# Patient Record
Sex: Male | Born: 1985 | Hispanic: Yes | Smoking: Never smoker
Health system: Southern US, Community
[De-identification: ages and names within clinical notes are randomized; demographics above are authoritative.]

---

## 2016-01-07 ENCOUNTER — Emergency Department (HOSPITAL_COMMUNITY)
Admission: EM | Admit: 2016-01-07 | Discharge: 2016-01-07 | Disposition: A | Discharge status: Home / Self Care | Payer: Self-pay | Attending: Emergency Medicine | Admitting: Emergency Medicine

## 2016-01-07 ENCOUNTER — Emergency Department (HOSPITAL_COMMUNITY): Payer: Self-pay

## 2016-01-07 ENCOUNTER — Encounter (HOSPITAL_COMMUNITY): Payer: Self-pay

## 2016-01-07 DIAGNOSIS — J029 Acute pharyngitis, unspecified: Secondary | ICD-10-CM | POA: Insufficient documentation

## 2016-01-07 DIAGNOSIS — R040 Epistaxis: Secondary | ICD-10-CM | POA: Insufficient documentation

## 2016-01-07 DIAGNOSIS — M549 Dorsalgia, unspecified: Secondary | ICD-10-CM | POA: Insufficient documentation

## 2016-01-07 NOTE — Discharge Instructions (Signed)
Dolor de garganta  °(Sore Throat) ° El dolor de garganta es el dolor, ardor o sensación de picazón en la garganta. Puede haber dolor o molestias al tragar o hablar. Es posible que tenga otros síntomas junto al dolor de garganta. Puede haber tos, estornudos, fiebre o una inflamación en el cuello. Generalmente es el primer signo de otra enfermedad. Estas enfermedades pueden incluir un resfriado, gripe, dolor de garganta o una infección llamada mononucleosis infecciosa. Generalmente el dolor de garganta desaparece sin tratamiento médico.  °CUIDADOS EN EL HOGAR  °· Sólo tome los medicamentos que le indique el médico. °· Beba gran cantidad de líquido para mantener el pis (orina) de tono claro o amarillo pálido. °· Descanse todo lo que sea necesario. °· Trate de usar aerosoles para la garganta, pastillas o chupe caramelos duros (si es mayor de 4 años o según lo que le indiquen). °· Beba líquidos calientes, como caldos, infusiones o agua caliente con miel. Trate de chupar paletas de hielo congelado o beber líquidos fríos. °· Enjuáguese la boca (gárgaras) con agua salada. Mezcle 1 cucharadita de sal en 8 onzas de agua. °· No fume. Evite estar cerca a otros cuando están fumando. °· Ponga un humidificador en su habitación por la noche para humedecer el aire. También puede abrir la ducha de agua caliente y sentarse en el baño durante 5-10 minutos. Asegúrese de que la puerta del baño esté cerrada. °SOLICITE AYUDA DE INMEDIATO SI:  °· Tiene dificultad para respirar. °· No puede tragar líquidos, alimentos blandos o su saliva. °· Usted tiene más inflamación (hinchazón) en la garganta. °· El dolor de garganta no mejora en 7 días. °· Siente malestar estomacal (náuseas) y vomita. °· Tiene fiebre o síntomas que persisten durante más de 2-3 días. °· Tiene fiebre y los síntomas empeoran de manera súbita. °ASEGÚRESE DE QUE:  °· Comprende estas instrucciones. °· Controlará su enfermedad. °· Solicitará ayuda de inmediato si no mejora o si  empeora. °  °Esta información no tiene como fin reemplazar el consejo del médico. Asegúrese de hacerle al médico cualquier pregunta que tenga. °  °Document Released: 09/09/2012 °Elsevier Interactive Patient Education ©2016 Elsevier Inc. ° ° °

## 2016-01-07 NOTE — ED Provider Notes (Signed)
CSN: [id number]     Arrival date & time 01/07/16  2027 History  By signing my name below, I, Evon Slackerrance Branch, attest that this documentation has been prepared under the direction and in the presence of Newell RubbermaidJeffrey Hearl Heikes, PA-C. Electronically Signed: Evon Slackerrance Branch, ED Scribe. 01/07/2016. 10:23 PM.     Chief Complaint  Patient presents with  . Sore Throat   The history is provided by the patient. No language interpreter was used.   HPI Comments: Kendrick Ranchsidro Sandin is a 30 y.o. male who presents to the Emergency Department complaining of sore throat onset 2 weeks prior. Pt reports that he swallowed a fish bone 6 years prior. Pt states his throat has recent been irritated again within in the last 2 weeks. Pt states that it is painful to swallow. Pt states that he has associated back pain from the sore throat. Pt also reports intermittent cough with epistaxis for the last week. Pt states that the pain is worse when breathing, laughing and eating. Pt denies tobacco use. Pt denies fevers or unexpected weight loss.  Marland Kitchen.    History reviewed. No pertinent past medical history. History reviewed. No pertinent past surgical history. No family history on file. Social History  Substance Use Topics  . Smoking status: Never Smoker   . Smokeless tobacco: None  . Alcohol Use: Yes    Review of Systems  All other systems reviewed and are negative.   Allergies  Review of patient's allergies indicates no known allergies.  Home Medications   Prior to Admission medications   Not on File   BP 117/69 mmHg  Pulse 66  Temp(Src) 99.1 F (37.3 C) (Oral)  Resp 18  SpO2 97%   Physical Exam  Constitutional: He is oriented to person, place, and time. He appears well-developed and well-nourished. No distress.  HENT:  Head: Normocephalic and atraumatic.  Mouth/Throat: Uvula is midline, oropharynx is clear and moist and mucous membranes are normal. No oropharyngeal exudate, posterior oropharyngeal edema,  posterior oropharyngeal erythema or tonsillar abscesses.  Eyes: Conjunctivae and EOM are normal.  Neck: Neck supple. No tracheal deviation present.  No asymmetry of the neck noted, nontender to palpation  Cardiovascular: Normal rate.   Pulmonary/Chest: Effort normal. No respiratory distress. He has no wheezes. He has no rales. He exhibits no tenderness.  Musculoskeletal: Normal range of motion.  Neurological: He is alert and oriented to person, place, and time.  Skin: Skin is warm and dry.  Psychiatric: He has a normal mood and affect. His behavior is normal.  Nursing note and vitals reviewed.   ED Course  Procedures (including critical care time) DIAGNOSTIC STUDIES: Oxygen Saturation is 97% on RA, normal by my interpretation.    COORDINATION OF CARE: 10:23 PM-Discussed treatment plan with pt at bedside and pt agreed to plan.    Labs Review Labs Reviewed - No data to display  Imaging Review Dg Neck Soft Tissue  01/07/2016  CLINICAL DATA:  Sore throat, difficulty swallowing, epistaxis and coughing. History of fish bone ingestion. EXAM: NECK SOFT TISSUES - 1+ VIEW COMPARISON:  None. FINDINGS: There is no evidence of retropharyngeal soft tissue swelling or epiglottic enlargement. The cervical airway is unremarkable and no radio-opaque foreign body identified. IMPRESSION: Negative. Electronically Signed   By: Awilda Metroourtnay  Bloomer M.D.   On: 01/07/2016 23:12   Dg Chest 2 View  01/07/2016  CLINICAL DATA:  Sore throat, difficulty swallowing, epistaxis and coughing. History of fish bone ingestion. EXAM: CHEST  2 VIEW COMPARISON:  None.  FINDINGS: The heart size and mediastinal contours are within normal limits. Both lungs are clear. The visualized skeletal structures are unremarkable. IMPRESSION: Normal chest. Electronically Signed   By: Awilda Metro M.D.   On: 01/07/2016 23:11      EKG Interpretation None      MDM   Final diagnoses:  Sore throat   Labs: DG chest and throat    Imaging:  Consults:  Therapeutics:  Discharge Meds:   Assessment/Plan: 30 year old male presents today with complaints of sore throat. This is been present for 6 months, worsening over the last several weeks. Patient has no signs of infectious etiology, allergic. Patient reports this started after swallowing a fish bone. I cannot find any signs of trauma to the throat, radiographs show no evidence of radiopaque object. I have low suspicion for cancerous etiology as patient has no fevers, weight loss, systemic symptoms, and this is been present for the last 6 months. [facility] He will be referred to ENT for further evaluation, encouraged follow-up with primary care for reevaluation as well. Translator was used in addition to patient's son who is very helpful throughout evaluation.   I personally performed the services described in this documentation, which was scribed in my presence. The recorded information has been reviewed and is accurate.    Eyvonne Mechanic, PA-C 01/07/16 2344  Margarita Grizzle, MD 01/08/16 [phone]

## 2016-01-07 NOTE — ED Notes (Addendum)
Pt here with c/o sore throat and painful to swallow, he states it feels like a butterfly tickling his throat; onset two weeks ago but pain worsens with each day. He also reports nose bleeds when he coughs and laughs. No active bleeding.

## 2017-10-07 IMAGING — DX DG CHEST 2V
2 series · 2 of 2 positions shown · non-contrast
Comparison: None.

CLINICAL DATA: Sore throat, difficulty swallowing, epistaxis and
coughing. History of fish bone ingestion.

EXAM:
CHEST  2 VIEW

[w chest pa]
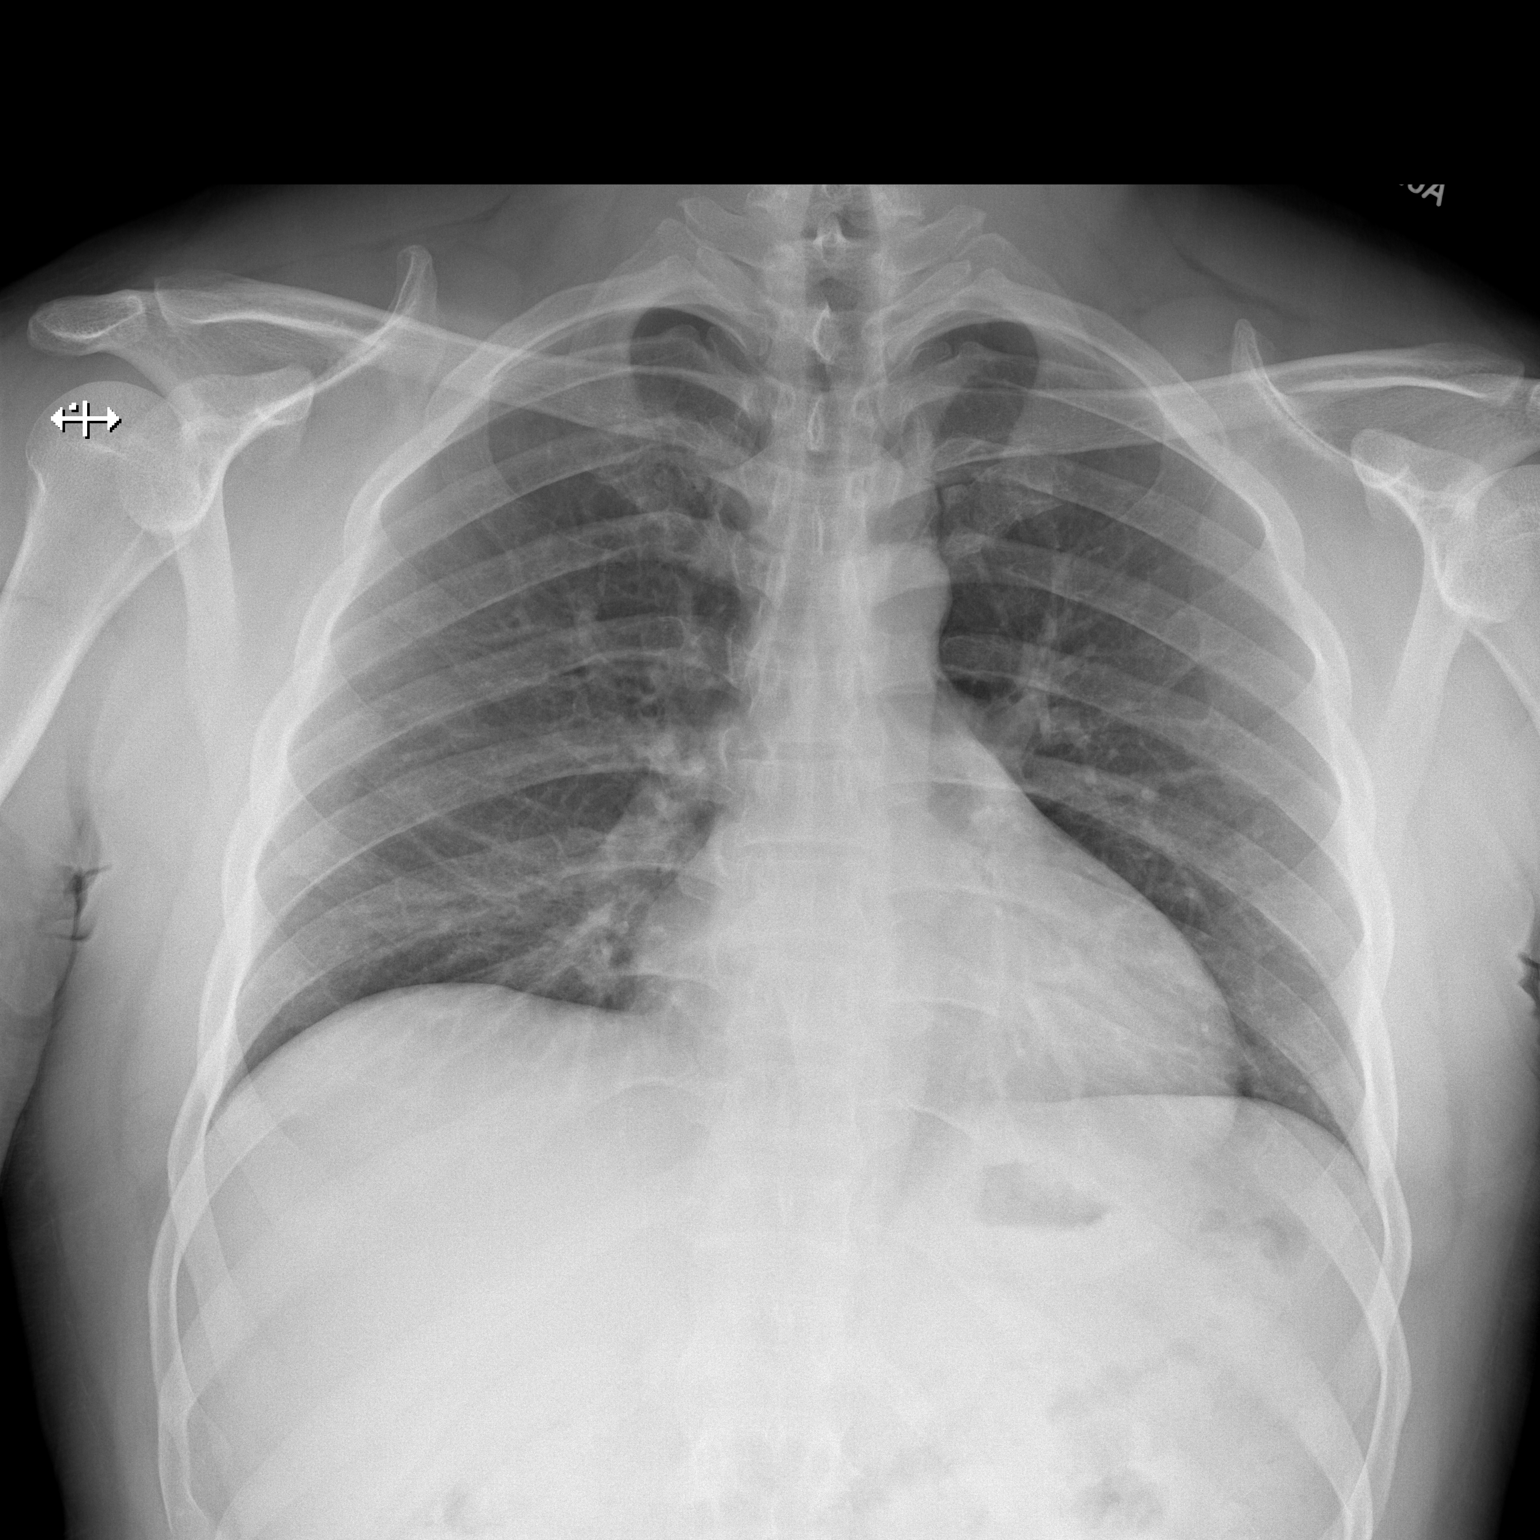

[w chest lat]
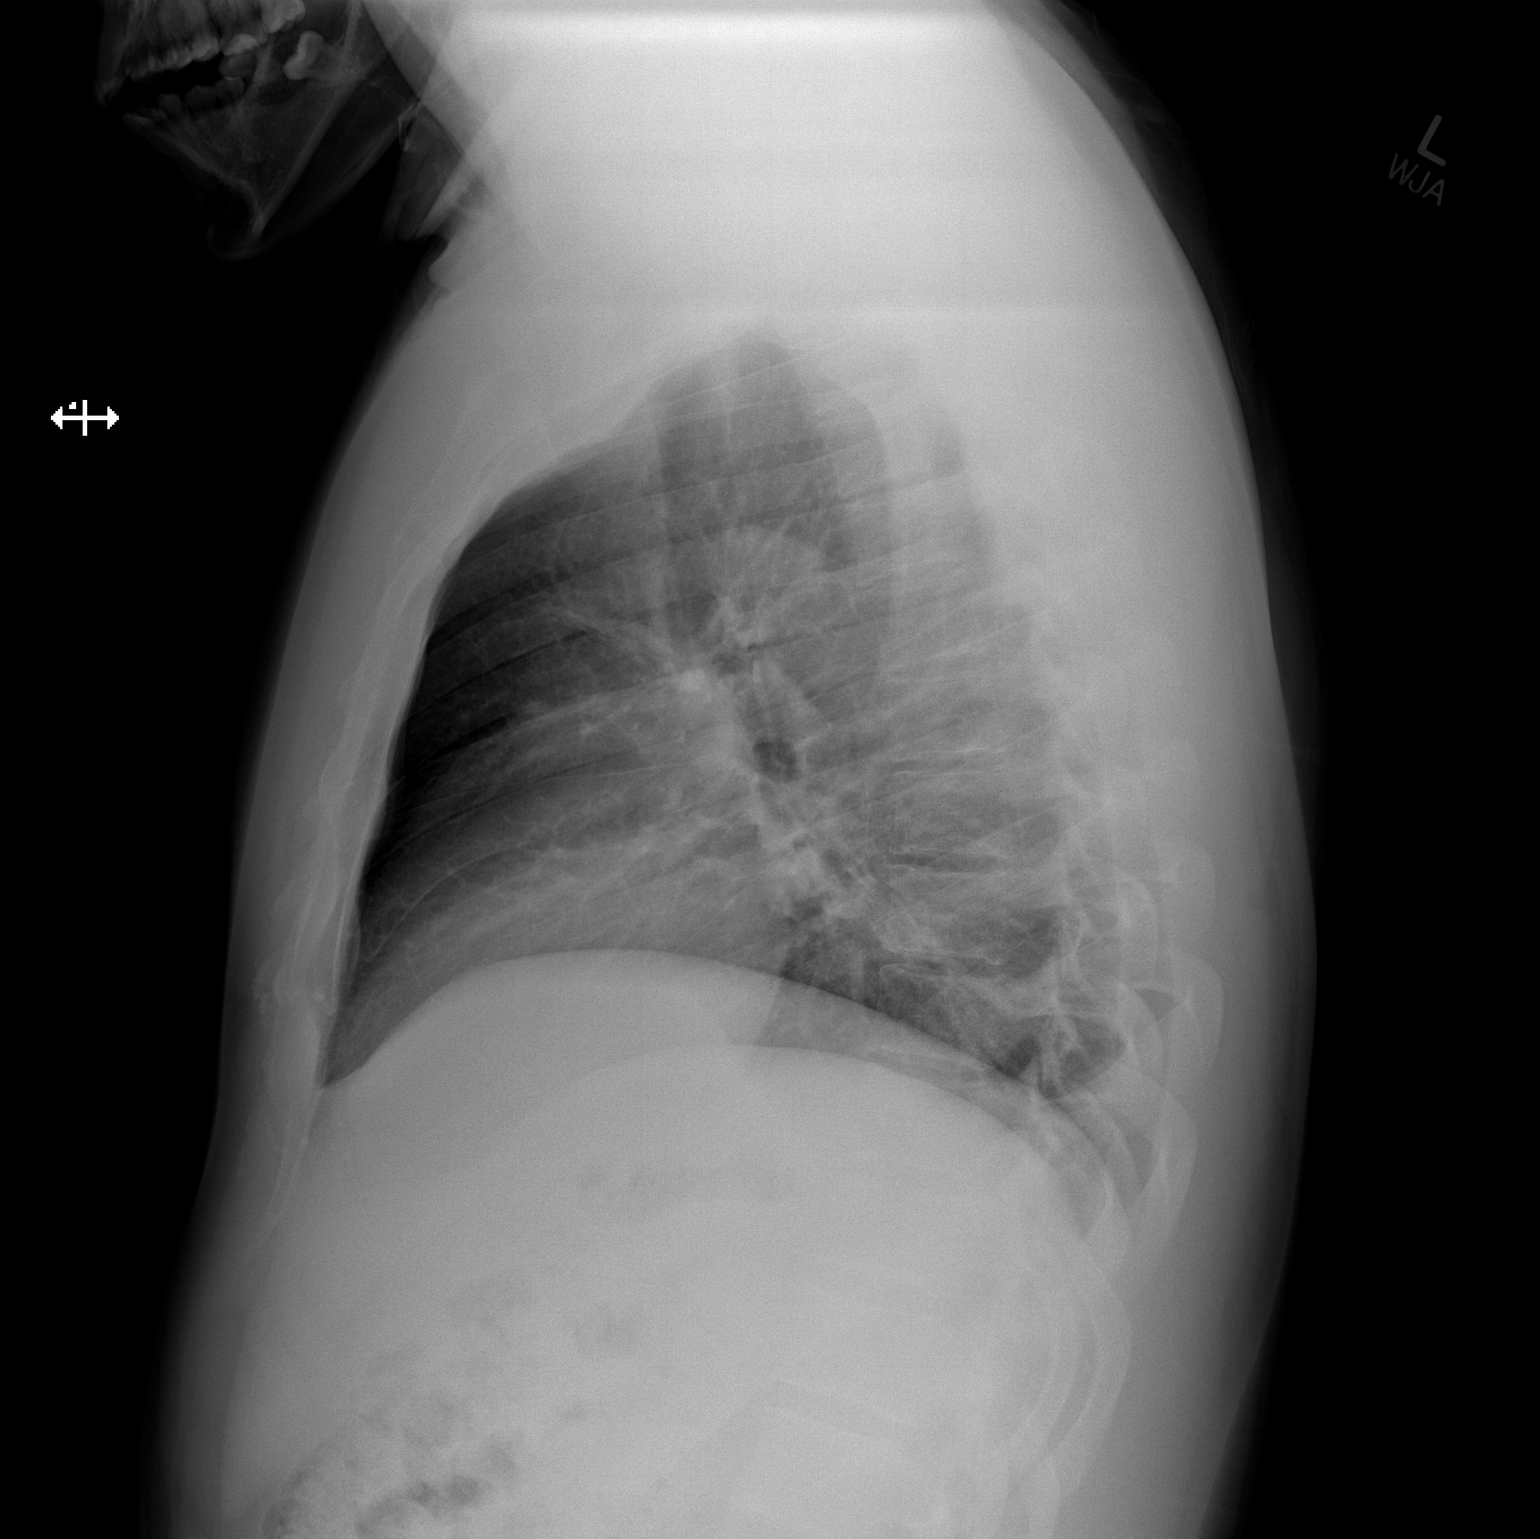

[2 of 2 positions shown; findings below may reference images not displayed]

FINDINGS: The heart size and mediastinal contours are within normal limits.
Both lungs are clear. The visualized skeletal structures are
unremarkable.
IMPRESSION: Normal chest.

## 2017-10-07 IMAGING — DX DG NECK SOFT TISSUE
2 series · 2 of 2 positions shown · non-contrast
Comparison: None.

CLINICAL DATA: Sore throat, difficulty swallowing, epistaxis and
coughing. History of fish bone ingestion.

EXAM:
NECK SOFT TISSUES - 1+ VIEW

[w soft tissue neck lat]
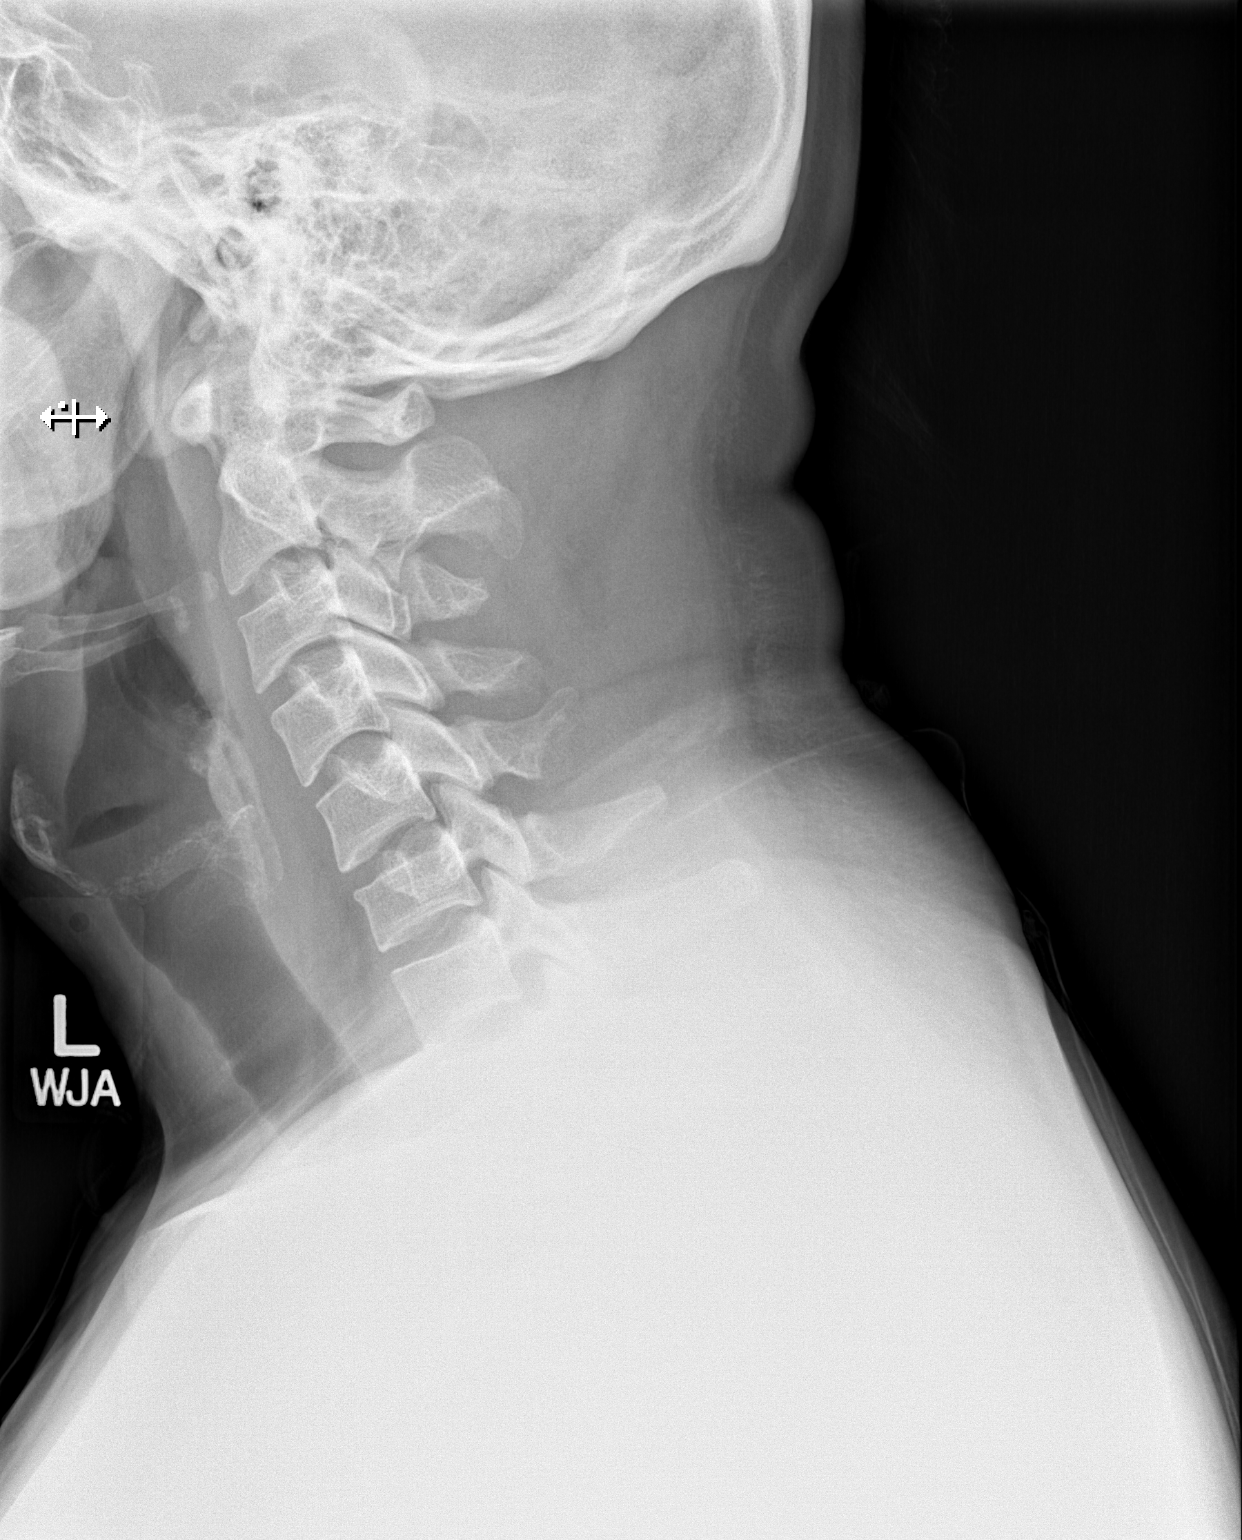

[w soft tissue neck ap]
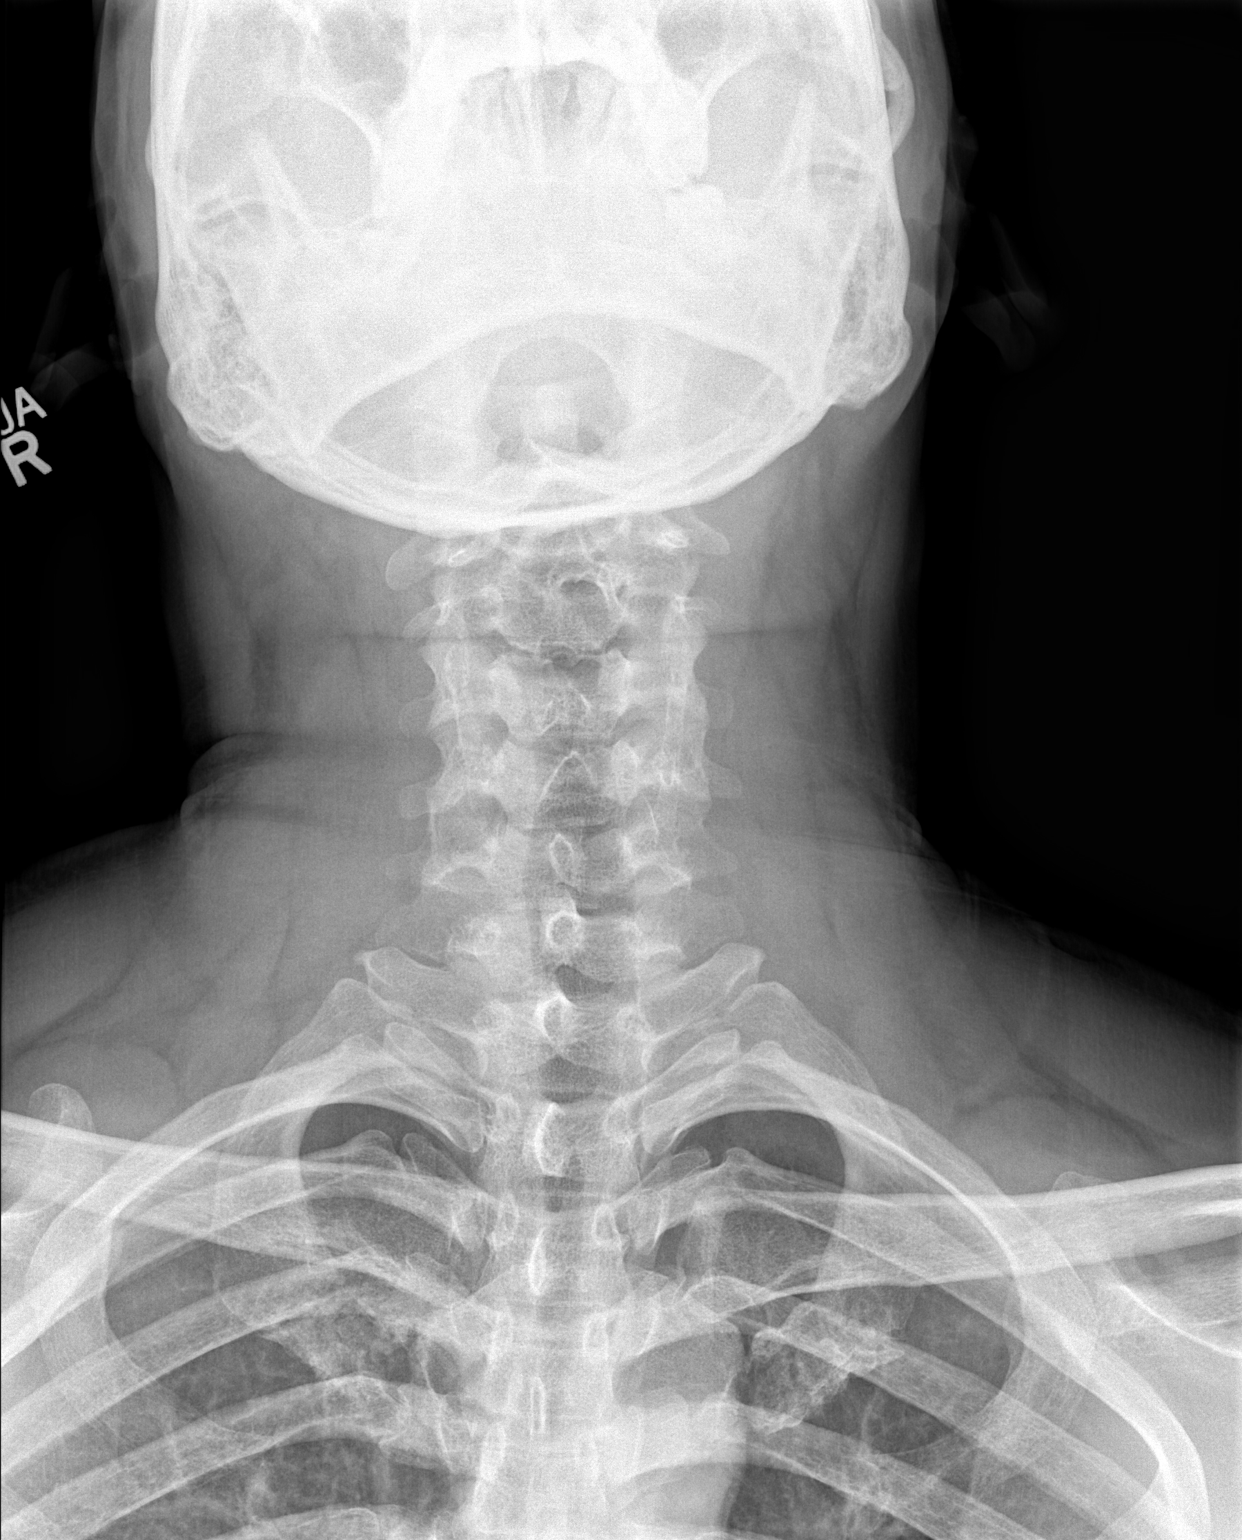

[2 of 2 positions shown; findings below may reference images not displayed]

FINDINGS: There is no evidence of retropharyngeal soft tissue swelling or
epiglottic enlargement. The cervical airway is unremarkable and no
radio-opaque foreign body identified.
IMPRESSION: Negative.

## 2023-04-12 ENCOUNTER — Emergency Department: Payer: Self-pay

## 2023-04-12 ENCOUNTER — Emergency Department
Admission: EM | Admit: 2023-04-12 | Discharge: 2023-04-12 | Disposition: A | Discharge status: Home / Self Care | Payer: Self-pay | Attending: Emergency Medicine | Admitting: Emergency Medicine

## 2023-04-12 ENCOUNTER — Other Ambulatory Visit: Payer: Self-pay

## 2023-04-12 DIAGNOSIS — Y990 Civilian activity done for income or pay: Secondary | ICD-10-CM | POA: Diagnosis not present

## 2023-04-12 DIAGNOSIS — S61411A Laceration without foreign body of right hand, initial encounter: Secondary | ICD-10-CM | POA: Diagnosis not present

## 2023-04-12 DIAGNOSIS — W293XXA Contact with powered garden and outdoor hand tools and machinery, initial encounter: Secondary | ICD-10-CM | POA: Diagnosis not present

## 2023-04-12 DIAGNOSIS — S6992XA Unspecified injury of left wrist, hand and finger(s), initial encounter: Secondary | ICD-10-CM | POA: Diagnosis present

## 2023-04-12 MED ORDER — LIDOCAINE HCL (PF) 1 % IJ SOLN
30.0000 mL | Freq: Once | INTRAMUSCULAR | Status: AC
  Administered 2023-04-12: 30 mL
  Filled 2023-04-12: qty 30

## 2023-04-12 MED ORDER — FENTANYL CITRATE PF 50 MCG/ML IJ SOSY
50.0000 ug | PREFILLED_SYRINGE | Freq: Once | INTRAMUSCULAR | Status: AC
  Administered 2023-04-12: 50 ug via INTRAVENOUS
  Filled 2023-04-12: qty 1

## 2023-04-12 MED ORDER — CEFAZOLIN SODIUM-DEXTROSE 1-4 GM/50ML-% IV SOLN
1.0000 g | Freq: Once | INTRAVENOUS | Status: AC
  Administered 2023-04-12: 1 g via INTRAVENOUS
  Filled 2023-04-12: qty 50

## 2023-04-12 MED ORDER — CEPHALEXIN 500 MG PO CAPS: 500.0000 mg | ORAL_CAPSULE | Freq: Four times a day (QID) | ORAL | 0 refills | Status: AC

## 2023-04-12 NOTE — ED Notes (Signed)
Laceration cart placed at bedside.

## 2023-04-12 NOTE — Discharge Instructions (Addendum)
These call and schedule a follow-up visit with our orthopedic surgery team this week.  You will need this to have your wound rechecked.  You have been seen in the Emergency Department (ED) today for a laceration (cut).  Please keep the cut clean but do not submerge it in the water.  It has been repaired with staples or sutures that will need to be removed in about 10 days. Please follow up with our orthopedic surgeon, Dr. Joice Lofts. Call to schedule an appointment this week.    Please follow up with your doctor as soon as possible regarding today's emergent visit.   Return to the ED or call your doctor if you notice any signs of infection such as fever, increased pain, increased redness, pus, or other symptoms that concern you.

## 2023-04-12 NOTE — ED Provider Notes (Signed)
[facility] Provider Note    Event Date/Time   First MD Initiated Contact with Patient 04/12/23 1233     (approximate)   History   Laceration  Spanish video interpreter utilized  HPI  Drew Williams is a 37 y.o. male reports no medical history.  No allergies  Just prior to arrival patient was sawing with a circular saw when his hand slipped and cut him below the thumb.  It bled quite a bit and has stopped bleeding now.  He reports tetanus shot up-to-date probably 2 to 3 years ago  He has no allergies.  He reports there is a lot of pain when he tries to move his thumb pointing towards the thenar eminence.  With some coaching and encouragement he is able to flex and extend the right thumb.  He is able to abduct and adduct the right thumb.  He is able to flex extend and spread all fingers of the right hand.  He denies that he feels numb but reports instead lots of pain in the thumb.  He was sawing wood when it happened     Physical Exam   Triage Vital Signs: ED Triage Vitals  Enc Vitals Group     BP 04/12/23 1219 135/85     Pulse Rate 04/12/23 1219 82     Resp 04/12/23 1219 18     Temp 04/12/23 1219 98.7 F (37.1 C)     Temp Source 04/12/23 1219 Oral     SpO2 04/12/23 1219 97 %     Weight --      Height --      Head Circumference --      Peak Flow --      Pain Score 04/12/23 1214 6     Pain Loc --      Pain Edu? --      Excl. in GC? --     Most recent vital signs: Vitals:   04/12/23 1219 04/12/23 1557  BP: 135/85 (!) 140/88  Pulse: 82 75  Resp: 18 18  Temp: 98.7 F (37.1 C) 98.6 F (37 C)  SpO2: 97% 98%     General: Awake, no distress.  Appears in some pain and holding his right hand.  Denies any other injury CV:  Good peripheral perfusion.  Normal capillary refill all digits right hand, strong right radial pulse. Resp:  Normal effort.  Abd:  No distention.  Other:  Atraumatic in all areas and extremities except with  regard to the right hand.  The right hand demonstrates normal motor and sensory strength involving the median ulnar and radial nerve.  He is able to flex and extend all fingers of the hand abduct and adduct flex and extend the thumb.  He has a rather moderate laceration probably 4 cm in length with avulsion of the central skin fragments.  There is some injury to the muscle belly but bleeding is controlled.  It does not appear to represent a through and through laceration through the muscle.  There is no obvious tendinous injury noted through passive range of motion of the thumb and digits.  Bleeding is controlled with pressure.  He is missing some of the skin, but I was able to approximate with interrupted sutures and then so together with running stitch with good hemostasis and affect.     ED Results / Procedures / Treatments   Labs (all labs ordered are listed, but only abnormal results are displayed) Labs Reviewed -  No data to display   EKG     RADIOLOGY  Personally interpreted the patient's right hand x-ray is negative for fracture.  There is edema overlying the thenar region of the right hand.  No foreign body observed   DG Hand Complete Right  Result Date: 04/12/2023 CLINICAL DATA:  Right hand laceration with circular Sol. EXAM: RIGHT HAND - COMPLETE 3+ VIEW COMPARISON:  None Available. FINDINGS: There is no evidence of fracture or dislocation. There is no evidence of arthropathy or other focal bone abnormality. No unexpected radiopaque foreign body. Soft tissue swelling noted in the region of the thenar eminence. IMPRESSION: Soft tissue swelling without fracture or unexpected radiopaque soft tissue foreign body. Electronically Signed   By: Kennith Center M.D.   On: 04/12/2023 14:19      PROCEDURES:  Critical Care performed: No  ..Laceration Repair  Date/Time: 04/12/2023 1:42 PM  Performed by: Sharyn Creamer, MD Authorized by: Sharyn Creamer, MD   Consent:    Consent obtained:   Verbal   Consent given by:  Patient   Risks discussed:  Infection, tendon damage, pain, vascular damage, retained foreign body, nerve damage and need for additional repair   Alternatives discussed:  Delayed treatment (reeval with orthopedics and possible delayed exploration and repair) Universal protocol:    Procedure explained and questions answered to patient or proxy's satisfaction: yes     Relevant documents present and verified: yes     Test results available: yes     Imaging studies available: yes     Patient identity confirmed:  Verbally with patient Anesthesia:    Anesthesia method:  Local infiltration   Local anesthetic:  Lidocaine 1% w/o epi (12ml) Laceration details:    Location:  Hand   Hand location: R hand thenar area, ventral.   Length (cm):  4   Depth (mm):  10 Pre-procedure details:    Preparation:  Patient was prepped and draped in usual sterile fashion and imaging obtained to evaluate for foreign bodies Exploration:    Limited defect created (wound extended): yes     Hemostasis achieved with:  Direct pressure   Imaging obtained: x-ray     Imaging outcome: foreign body not noted     Wound exploration: wound explored through full range of motion     Wound extent: areolar tissue violated and muscle damage     Wound extent: no foreign body, no nerve damage, no tendon damage, no underlying fracture and no vascular damage     Contaminated: no   Treatment:    Area cleansed with:  Povidone-iodine   Amount of cleaning:  Extensive   Irrigation solution:  Tap water   Visualized foreign bodies/material removed: no     Debridement:  None   Undermining:  None   Scar revision: no   Skin repair:    Repair method:  Sutures   Suture size:  3-0   Suture material:  Nylon   Number of sutures:  3 Approximation:    Approximation:  Loose Repair type:    Repair type:  Intermediate Post-procedure details:    Dressing:  Non-adherent dressing Comments:     Tolerated procedure  well.  There is no evidence of deficit in motor nerve function.  One running suture, 2 simple interrupted  MEDICATIONS ORDERED IN ED: Medications  lidocaine (PF) (XYLOCAINE) 1 % injection 30 mL (30 mLs Infiltration Given 04/12/23 1311)  ceFAZolin (ANCEF) IVPB 1 g/50 mL premix (0 g Intravenous Stopped 04/12/23 1347)  fentaNYL (  SUBLIMAZE) injection 50 mcg (50 mcg Intravenous Given 04/12/23 1321)     IMPRESSION / MDM / ASSESSMENT AND PLAN / ED COURSE  I reviewed the triage vital signs and the nursing notes.                              Differential diagnosis includes, but is not limited to, traumatic injury, laceration, foreign body, neurovascular injury, etc.  Thankfully the patient does not have evidence of neurovascular injury.  There are some limitation exam due to pain but he is able to demonstrate at least minimal flexion extension as well as abduction and adduction of the thumb.  Thenar eminence is involved but it does not appear to completely severed any muscle or nerve at this time.  He is still able to flex and extend the tip of the thumb as well as the full range of motion of the thumb but it is quite painful.    Patient's presentation is most consistent with acute complicated illness / injury requiring diagnostic workup.     Prescribed keflex  ----------------------------------------- 2:16 PM on 04/12/2023 ----------------------------------------- Reviewed clinical history examination injury type and x-ray with Dr. Joice Lofts advises that he will have clinic contact patient for a follow-up this week for reassessment reevaluation of the hand and for any evidence of potential injury, as well as follow-up for wound healing.  Discussed with the patient via interpreter, patient understanding of plan for discharge with careful follow-up and use of antibiotic.  Careful return precautions advised.  Placed into thumb spica with normal capillary perfusion and examination after  At this  juncture, no find no evidence of acute motor or sensory deficit, his wound has been repaired well irrigated with approximately 5 minutes of tap water washing, Betadine, and close exploration with no foreign bodies.  Return precautions and treatment recommendations and follow-up discussed with the patient who is agreeable with the plan.  Discussed with interpreter the importance of close follow-up, he will call and set up follow-up with orthopedics for this coming week.  Additionally he understands he needs to revisit in about 10 days for suture removal and wound recheck.  FINAL CLINICAL IMPRESSION(S) / ED DIAGNOSES   Final diagnoses:  Laceration of right hand without foreign body, initial encounter     Rx / DC Orders   ED Discharge Orders          Ordered    cephALEXin (KEFLEX) 500 MG capsule  4 times daily        04/12/23 1531             Note:  This document was prepared using Dragon voice recognition software and may include unintentional dictation errors.   Sharyn Creamer, MD 04/12/23 1700

## 2023-04-12 NOTE — ED Notes (Signed)
Pt wound irrigated and cleansed.

## 2023-04-12 NOTE — ED Triage Notes (Signed)
Pt states he cut right hand with and electric saw. Pt denies blood thinners, last tetanus shot was 3 years ago. ROM in right thumb is decreased. Bleeding controlled with gauze wrap at this time. PT AOX4, NAD noted.

## 2023-04-12 NOTE — ED Notes (Signed)
Pt provided with coke, crackers, and peanut butter.

## 2024-08-03 ENCOUNTER — Emergency Department (HOSPITAL_COMMUNITY)
Admission: EM | Admit: 2024-08-03 | Discharge: 2024-08-03 | Disposition: A | Discharge status: Home / Self Care | Payer: Self-pay | Attending: Emergency Medicine | Admitting: Emergency Medicine

## 2024-08-03 ENCOUNTER — Other Ambulatory Visit: Payer: Self-pay

## 2024-08-03 ENCOUNTER — Emergency Department (HOSPITAL_COMMUNITY): Payer: Self-pay

## 2024-08-03 DIAGNOSIS — M25562 Pain in left knee: Secondary | ICD-10-CM | POA: Insufficient documentation

## 2024-08-03 DIAGNOSIS — G8929 Other chronic pain: Secondary | ICD-10-CM | POA: Insufficient documentation

## 2024-08-03 MED ORDER — KETOROLAC TROMETHAMINE 30 MG/ML IJ SOLN
15.0000 mg | Freq: Once | INTRAMUSCULAR | Status: AC
  Administered 2024-08-03: 15 mg via INTRAMUSCULAR
  Filled 2024-08-03: qty 1

## 2024-08-03 MED ORDER — MELOXICAM 7.5 MG PO TABS: 7.5000 mg | ORAL_TABLET | Freq: Every day | ORAL | 0 refills | Status: AC

## 2024-08-03 NOTE — ED Triage Notes (Signed)
Pt c/o of chronic left knee pain.

## 2024-08-03 NOTE — ED Notes (Signed)
 ED Provider at bedside.

## 2024-08-03 NOTE — ED Provider Notes (Signed)
 [place] [facility] AT [facility] Provider Note   CSN: [id number] Arrival date & time: 08/03/24  1045     Patient presents with: Knee Pain (Left knee)   Drew Williams is a 38 y.o. male.    Spanish interpreter [place] used for H&P.  Patient presents with ongoing left knee pain.  He states that he has had pain for about 3 years.  Pain has been worse recently, prompting emergency department visit today.  No new falls or injuries.  No distal numbness or tingling.  Pain hurts in the inner aspect of the knee.  He has taken ibuprofen.  Usually this helps, not recently.  Denies swelling of the knee.  No fevers.  He has not seen an orthopedist.       Prior to Admission medications   Medication Sig Start Date End Date Taking? Authorizing Provider  meloxicam (MOBIC) 7.5 MG tablet Take 1 tablet (7.5 mg total) by mouth daily. 08/03/24  Yes June Vacha, PA-C  cephALEXin  (KEFLEX ) 500 MG capsule Take 1 capsule (500 mg total) by mouth 4 (four) times daily. 04/12/23   Dicky Anes, MD    Allergies: Patient has no known allergies.    Review of Systems  Updated Vital Signs BP 110/73 (BP Location: Right Arm)   Pulse 61   Temp 98.8 F (37.1 C)   Resp 18   Ht 5' 6 (1.676 m)   Wt 70.3 kg   SpO2 96%   BMI 25.02 kg/m   Physical Exam Vitals and nursing note reviewed.  Constitutional:      Appearance: He is well-developed.  HENT:     Head: Normocephalic and atraumatic.  Eyes:     Conjunctiva/sclera: Conjunctivae normal.  Cardiovascular:     Pulses: Normal pulses. No decreased pulses.  Musculoskeletal:        General: Tenderness present.     Cervical back: Normal range of motion and neck supple.     Left knee: No swelling or effusion. Normal range of motion. Tenderness present over the medial joint line. No lateral joint line tenderness.     Right lower leg: No edema.     Left lower leg: No edema.  Skin:    General: Skin is warm and dry.  Neurological:      Mental Status: He is alert.     Sensory: No sensory deficit.     Comments: Motor, sensation, and vascular distal to the injury is fully intact.   Psychiatric:        Mood and Affect: Mood normal.     (all labs ordered are listed, but only abnormal results are displayed) Labs Reviewed - No data to display  EKG: None  Radiology: DG Knee Complete 4 Views Left Result Date: 08/03/2024 CLINICAL DATA:  Medial left knee pain 2 years.  No injury. EXAM: DG KNEE COMPLETE 4+V*L* COMPARISON:  None Available. FINDINGS: No evidence of fracture, dislocation, or joint effusion. No evidence of arthropathy or other focal bone abnormality. Soft tissues are unremarkable. IMPRESSION: Negative. Electronically Signed   By: Toribio Agreste M.D.   On: 08/03/2024 11:47     Procedures   Medications Ordered in the ED  ketorolac (TORADOL) 30 MG/ML injection 15 mg (has no administration in time range)   ED Course  Patient seen and examined. History obtained directly from patient. Work-up including labs, imaging, EKG ordered in triage, if performed, were reviewed.    Labs/EKG: None ordered  Imaging: X-ray of the knee, agree negative.  Medications/Fluids: Ordered: IM Toradol  Most recent vital signs reviewed and are as follows: BP 110/73 (BP Location: Right Arm)   Pulse 61   Temp 98.8 F (37.1 C)   Resp 18   Ht 5' 6 (1.676 m)   Wt 70.3 kg   SpO2 96%   BMI 25.02 kg/m   Initial impression: Chronic left knee pain, no swelling or signs of septic arthritis.  Will give dose of IM Toradol here.  Home treatment plan: Meloxicam trial, outpatient follow-up with orthopedics  Return instructions discussed with patient: Inability to bear weight, inability to flex knee, fever  Follow-up instructions discussed with patient: Orthopedics in 1 week                                   Medical Decision Making Amount and/or Complexity of Data Reviewed Radiology: ordered.  Risk Prescription drug  management.   Left chronic knee pain.  No calf tenderness or swelling to suggest DVT.  No large effusion or decreased range of motion to suggest septic arthritis.  Likely degenerative in nature.  X-ray negative here.  Distal circulation, motor, and sensation intact.  Patient would benefit from outpatient orthopedic follow-up.  No indication for emergent orthopedic follow-up today.     Final diagnoses:  Chronic pain of left knee    ED Discharge Orders          Ordered    meloxicam (MOBIC) 7.5 MG tablet  Daily        08/03/24 1350               Desiderio Chew, PA-C 08/03/24 1355    Dasie Faden, MD 08/04/24 [phone]

## 2024-08-03 NOTE — Discharge Instructions (Addendum)
 Please read and follow all provided instructions.  Your diagnoses today include:  1. Chronic pain of left knee     Tests performed today include: An x-ray of the affected area - does NOT show any broken bones Vital signs. See below for your results today.   Medications prescribed:  Meloxicam - anti-inflammatory pain medication  You have been prescribed an anti-inflammatory medication or NSAID. Take with food. Do not take aspirin, ibuprofen, or naproxen if taking this medication. Take smallest effective dose for the shortest duration needed for your pain. Stop taking if you experience stomach pain or vomiting.   Take any prescribed medications only as directed.  Home care instructions:  Follow any educational materials contained in this packet Follow R.I.C.E. Protocol: R - rest your injury  I  - use ice on injury without applying directly to skin C - compress injury with bandage or splint E - elevate the injury as much as possible  Follow-up instructions: Please follow-up with your primary care provider or the provided orthopedic physician (bone specialist) in 1 week.   Return instructions:  Please return if your toes or feet are numb or tingling, appear gray or blue, or you have severe pain (also elevate the leg and loosen splint or wrap if you were given one) Please return to the Emergency Department if you experience worsening symptoms.  Please return if you have any other emergent concerns.  Additional Information:  Your vital signs today were: BP 110/73 (BP Location: Right Arm)   Pulse 61   Temp 98.8 F (37.1 C)   Resp 18   Ht 5' 6 (1.676 m)   Wt 70.3 kg   SpO2 96%   BMI 25.02 kg/m  If your blood pressure (BP) was elevated above 135/85 this visit, please have this repeated by your doctor within one month. --------------
